# Patient Record
Sex: Female | Born: 1970
Health system: Southern US, Community
[De-identification: ages and names within clinical notes are randomized; demographics above are authoritative.]

---

## 1999-04-29 ENCOUNTER — Encounter: Payer: Self-pay | Admitting: Chiropractic Medicine

## 1999-04-29 ENCOUNTER — Ambulatory Visit (HOSPITAL_COMMUNITY): Admission: RE | Admit: 1999-04-29 | Discharge: 1999-04-29 | Payer: Self-pay | Admitting: Chiropractic Medicine

## 2000-11-28 ENCOUNTER — Other Ambulatory Visit: Admission: RE | Admit: 2000-11-28 | Discharge: 2000-11-28 | Payer: Self-pay | Admitting: Gynecology

## 2001-12-04 ENCOUNTER — Other Ambulatory Visit: Admission: RE | Admit: 2001-12-04 | Discharge: 2001-12-04 | Payer: Self-pay | Admitting: Gynecology

## 2002-09-21 ENCOUNTER — Ambulatory Visit (HOSPITAL_COMMUNITY): Admission: RE | Admit: 2002-09-21 | Discharge: 2002-09-21 | Payer: Self-pay | Admitting: Obstetrics and Gynecology

## 2002-09-21 ENCOUNTER — Encounter (INDEPENDENT_AMBULATORY_CARE_PROVIDER_SITE_OTHER): Payer: Self-pay | Admitting: *Deleted

## 2003-03-20 ENCOUNTER — Other Ambulatory Visit: Admission: RE | Admit: 2003-03-20 | Discharge: 2003-03-20 | Payer: Self-pay | Admitting: Obstetrics and Gynecology

## 2003-07-24 ENCOUNTER — Ambulatory Visit (HOSPITAL_COMMUNITY): Admission: RE | Admit: 2003-07-24 | Discharge: 2003-07-24 | Payer: Self-pay | Admitting: Obstetrics and Gynecology

## 2003-09-01 ENCOUNTER — Inpatient Hospital Stay (HOSPITAL_COMMUNITY): Admission: AD | Admit: 2003-09-01 | Discharge: 2003-09-06 | Payer: Self-pay | Admitting: Obstetrics and Gynecology

## 2003-09-02 ENCOUNTER — Encounter (INDEPENDENT_AMBULATORY_CARE_PROVIDER_SITE_OTHER): Payer: Self-pay | Admitting: Specialist

## 2003-09-07 ENCOUNTER — Encounter: Admission: RE | Admit: 2003-09-07 | Discharge: 2003-10-07 | Payer: Self-pay | Admitting: Obstetrics and Gynecology

## 2003-10-08 ENCOUNTER — Encounter: Admission: RE | Admit: 2003-10-08 | Discharge: 2003-11-07 | Payer: Self-pay | Admitting: Obstetrics and Gynecology

## 2003-10-10 ENCOUNTER — Other Ambulatory Visit: Admission: RE | Admit: 2003-10-10 | Discharge: 2003-10-10 | Payer: Self-pay | Admitting: Obstetrics and Gynecology

## 2003-12-06 ENCOUNTER — Encounter: Admission: RE | Admit: 2003-12-06 | Discharge: 2004-01-05 | Payer: Self-pay | Admitting: Obstetrics and Gynecology

## 2004-06-08 ENCOUNTER — Encounter: Admission: RE | Admit: 2004-06-08 | Discharge: 2004-06-08 | Payer: Self-pay | Admitting: Family Medicine

## 2004-11-30 ENCOUNTER — Other Ambulatory Visit: Admission: RE | Admit: 2004-11-30 | Discharge: 2004-11-30 | Payer: Self-pay | Admitting: Obstetrics and Gynecology

## 2005-03-05 ENCOUNTER — Encounter: Admission: RE | Admit: 2005-03-05 | Discharge: 2005-03-05 | Payer: Self-pay | Admitting: Family Medicine

## 2005-07-26 ENCOUNTER — Ambulatory Visit (HOSPITAL_COMMUNITY): Admission: RE | Admit: 2005-07-26 | Discharge: 2005-07-26 | Payer: Self-pay | Admitting: Obstetrics and Gynecology

## 2005-08-20 ENCOUNTER — Other Ambulatory Visit: Admission: RE | Admit: 2005-08-20 | Discharge: 2005-08-20 | Payer: Self-pay | Admitting: Obstetrics and Gynecology

## 2005-12-28 ENCOUNTER — Ambulatory Visit (HOSPITAL_COMMUNITY): Admission: RE | Admit: 2005-12-28 | Discharge: 2005-12-28 | Payer: Self-pay | Admitting: Obstetrics and Gynecology

## 2006-02-27 ENCOUNTER — Inpatient Hospital Stay (HOSPITAL_COMMUNITY): Admission: AD | Admit: 2006-02-27 | Discharge: 2006-02-28 | Payer: Self-pay | Admitting: Obstetrics and Gynecology

## 2006-02-28 ENCOUNTER — Inpatient Hospital Stay (HOSPITAL_COMMUNITY): Admission: AD | Admit: 2006-02-28 | Discharge: 2006-03-02 | Payer: Self-pay | Admitting: Obstetrics and Gynecology

## 2007-02-17 ENCOUNTER — Encounter: Admission: RE | Admit: 2007-02-17 | Discharge: 2007-02-17 | Payer: Self-pay | Admitting: Family Medicine

## 2007-04-19 ENCOUNTER — Encounter: Admission: RE | Admit: 2007-04-19 | Discharge: 2007-04-19 | Payer: Self-pay | Admitting: Obstetrics and Gynecology

## 2007-09-26 ENCOUNTER — Ambulatory Visit (HOSPITAL_COMMUNITY): Admission: RE | Admit: 2007-09-26 | Discharge: 2007-09-27 | Payer: Self-pay | Admitting: Neurosurgery

## 2007-11-13 ENCOUNTER — Encounter: Admission: RE | Admit: 2007-11-13 | Discharge: 2007-11-13 | Payer: Self-pay | Admitting: Obstetrics and Gynecology

## 2007-11-28 ENCOUNTER — Encounter: Admission: RE | Admit: 2007-11-28 | Discharge: 2007-11-28 | Payer: Self-pay | Admitting: Neurosurgery

## 2008-06-24 ENCOUNTER — Encounter: Admission: RE | Admit: 2008-06-24 | Discharge: 2008-06-24 | Payer: Self-pay | Admitting: Obstetrics and Gynecology

## 2008-09-20 ENCOUNTER — Encounter: Admission: RE | Admit: 2008-09-20 | Discharge: 2008-09-20 | Payer: Self-pay | Admitting: Obstetrics and Gynecology

## 2009-01-12 IMAGING — MG MM DIAGNOSTIC UNILATERAL R
2 series · 2 of 2 positions shown · non-contrast
Comparison: none

DG DIAGNOSTIC UNILATERAL R
CC and MLO view(s) were taken of the right breast.
Technologist: Rtoyota Joshjax

RIGHT BREAST ULTRASOUND
DIGITAL UNILATERAL RIGHT DIAGNOSTIC MAMMOGRAM AND RIGHT BREAST ULTRASOUND:
CLINICAL DATA: Patient returns after a baseline screening exam on 04/11/07 for evaluation of the 
right breast.  Patient's mother was diagnosed with breast cancer at age 55.

[R CC]
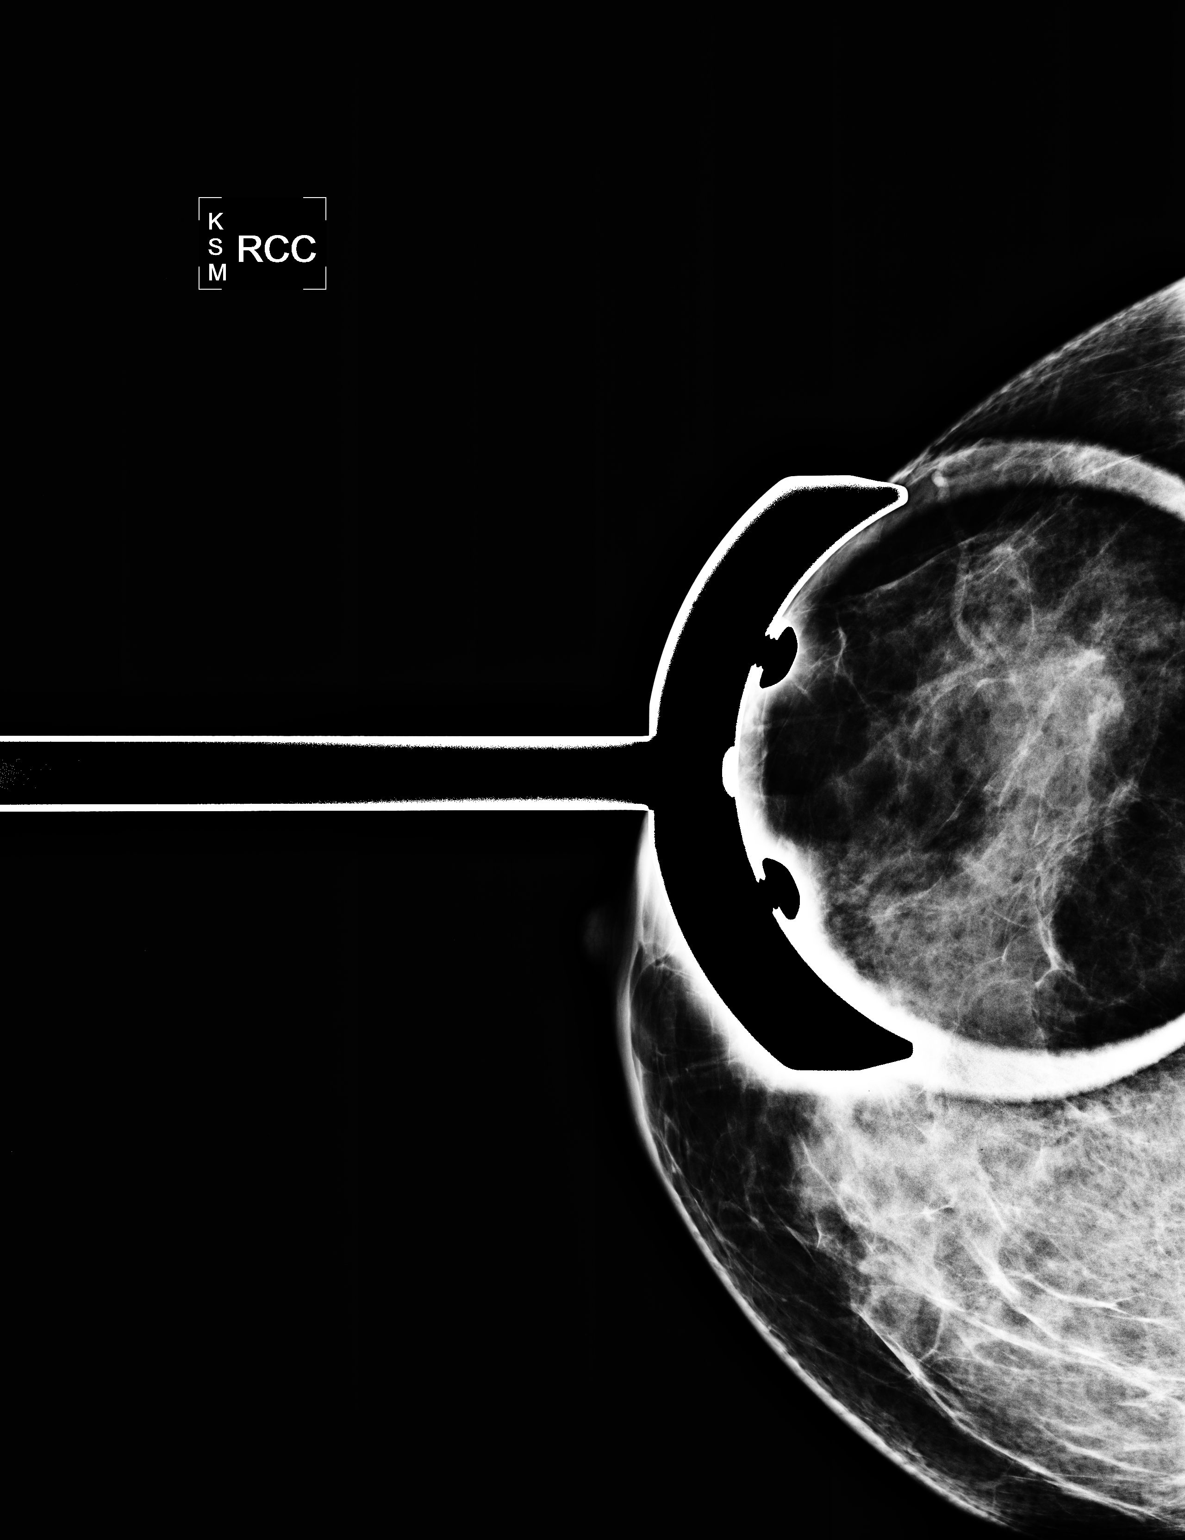

[R MLO]
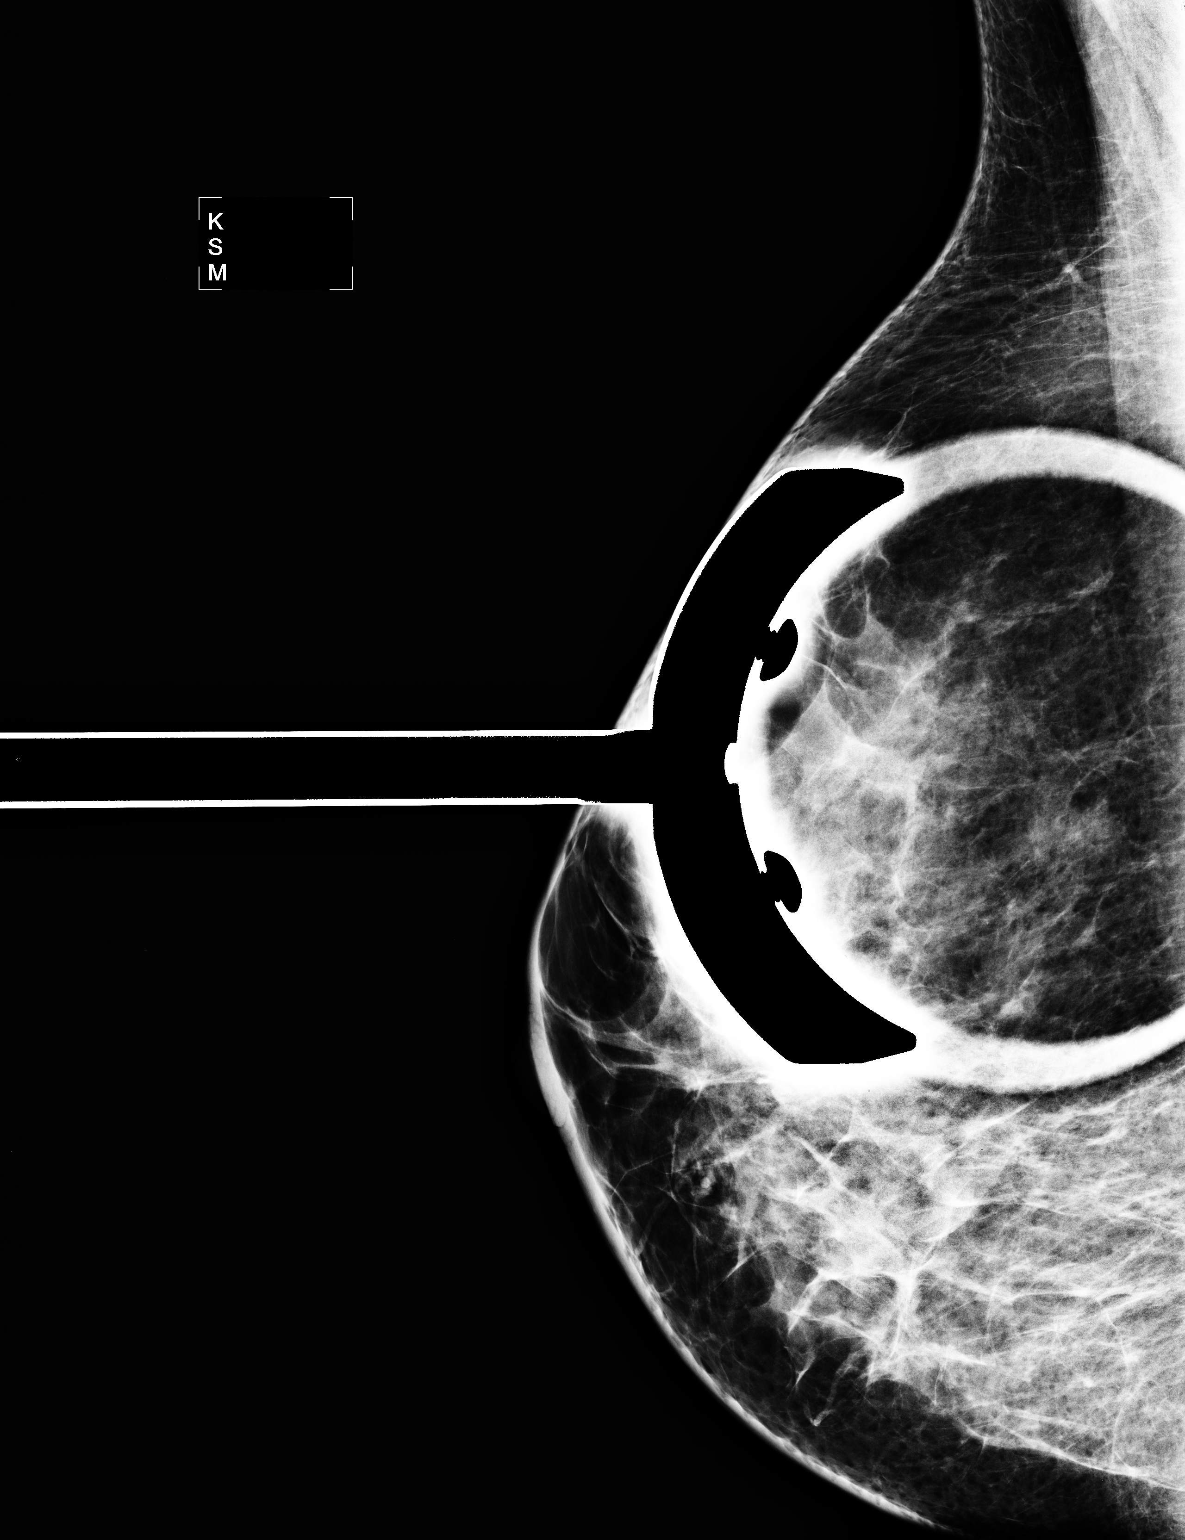

[2 of 2 positions shown; findings below may reference images not displayed]

Spot compression views are performed of the right breast, confirming the presence of an oval 
obscured nodule in the lateral portion of the right breast.  On physical exam, I do not palpate an 
abnormality in the lateral portion of the right breast.  Ultrasound is performed showing a 
well-circumscribed hypoechoic nodule with increased through-transmission in the 9 o'clock position 
of the right breast 6 cm from the nipple.  This measures 1.4 x 1.3 x 0.5 cm and correlates well 
with the mammographic abnormality.  Findings are consistent with benign fibroadenoma.  No worrisome
mass is identified
IMPRESSION: Probable fibroadenoma in the 9 o'clock position of the right breast on the baseline exam.  We 
discussed the options of biopsy versus close follow-up.  Close follow-up is suggested and a right 
breast ultrasound is recommended in six months.

ASSESSMENT: Probably benign - BI-RADS 3

Ultrasound of the right breast in 6 months.
,

## 2009-07-14 ENCOUNTER — Encounter: Admission: RE | Admit: 2009-07-14 | Discharge: 2009-07-14 | Payer: Self-pay | Admitting: Obstetrics and Gynecology

## 2010-09-27 ENCOUNTER — Encounter: Payer: Self-pay | Admitting: Obstetrics and Gynecology

## 2011-01-19 NOTE — Op Note (Signed)
NAMESAKEENAH, VALCARCEL             ACCOUNT NO.:  [id number]   MEDICAL RECORD NO.:  [id number]          PATIENT TYPE:  AMB   LOCATION:  SDS                          FACILITY:  [facility]   PHYSICIAN:  Henry A. Pool, M.D.    DATE OF BIRTH:  1971/08/10   DATE OF PROCEDURE:  09/26/2007  DATE OF DISCHARGE:                               OPERATIVE REPORT   SERVICE:  Neurosurgery.   PREOPERATIVE DIAGNOSIS:  C5-C6 spondylosis and right C6-C7 herniated  nucleus pulposus with radiculopathy.   POSTOPERATIVE DIAGNOSIS:  C5-C6 spondylosis and right C6-C7 herniated  nucleus pulposus with radiculopathy.   PROCEDURE:  C5-C6 and C6-C7 anterior cervical diskectomy and fusion with  allograft interplating.   SURGEON:  Kathaleen Maser. Pool, M.D.   ASSISTANT:  Reinaldo Meeker, M.D.   ANESTHESIA:  General oroendotracheal.   INDICATION:  Ms. Dancer is a 40 year old female with a history of neck  and right upper extremity pain, paresthesias, and weakness consistent  with a right-sided C7 radiculopathy.  Her workup demonstrates evidence  of a significant right-sided C6-C7 disk herniation, compression upon the  right-sided C7 nerve root.  The patient has coexistent cervical  spondylosis at C5-C6 with chronic neck pain.  We discussed the options  for management including both operative and nonoperative management.  We  discussed in detail the possibility of proceeding with a C5-C6 at C6-C7  anterior cervical diskectomy fusion with allograft interplating.  The  patient was made aware the risks and benefits and wished to proceed.   OPERATIVE NOTE:  The patient was taken to the operative room and placed  on the table in a supine position.  After adequate level of anesthesia  was achieved, the patient was positioned supine with neck slightly  extended and held by halter traction.  The patient's anterior cervical  region was prepped and draped sterilely.  A 10 blade was used to make  a  linear skin incision overlying  the C6 vertebral level.  This was carried  down sharply to the platysma.  The platysma was then divided vertically  and dissection proceeded on the medial border of the sternocleidomastoid  muscle and carotid sheath.  Trachea and esophagus were mobilized and  tracked towards the left.  Prevertebral fascia was stripped off the  anterior spinal column.  Longus coli muscle was then elevated  bilaterally using electrocautery.  Deep self-retaining retractor was  placed.  Intraoperative fluoroscopy was used.  C5-C6 and C6-C7 levels  were confirmed.  Disk space at both levels was then incised with a 15  blade in a rectangular fashion.  A wide disk space clean-out was then  achieved using pituitary rongeurs, Carlen curettes, Kerrison rongeurs,  high-speed drill carried almost to the disk removed down to the level of  posterior annulus.  The microscope was then brought in the field and  used throughout the remainder of diskectomy.  The remaining aspects of  the annulus and osteophytes removed using the high-speed drill down to  the level of the posterior longitudinal plane and was then elevated and  resected piecemeal fashion using Kerrison rongeurs where the underlying  thecal sac was identified.  Wide central decompression was then  performed by undercutting the bodies of C5 and C6 using Kerrison  rongeurs for decompression.  Decompression then proceeded out on each  neural foramen.  Wide anterior foraminotomy was then performed along the  course exiting C6 nerve roots bilaterally.  At this time, a very  thorough decompression was achieved with no evidence of injury to thecal  sac or nerve roots.  The C6-C7 level was approached in a similar fashion  once a good decompression was achieved.  A large amount of free disk  herniation off the right side at C6-C7 was encountered and completely  resected.  The wound was then irrigated with antibiotic solution.  Gelfoam was placed topically.  Hemostasis  was found to be good.  A 5-mm  Life allograft wedge was then packed into place and recessed roughly 1  mm from the anterior cortical margin at C5-C6.  A 6-mm wedge was placed  at C6-C7.  A 42-mm Atlantis anterior plate was then placed over the C5,  C6, and C7 levels.  This was attached under fluoroscopic guidance using  13-mm variable screws to each at all three levels.  All six screws were  given final tightening and found be solid to the bone.  The interlocking  screws engaged at all three levels.  Final images revealed good position  of bone grafts with hardware where appropriate and a normal spine.  The  wounds were then irrigated one final time.  Hemostasis was ensured with  electrocautery.  The wound was then closed in typical fashion.  Steri-  Strips and sterile dressing were applied.  There were no complications.  The patient tolerated the procedure well, and she returned to the  recovery room postoperatively.           ______________________________  Kathaleen Maser Pool, M.D.     HAP/MEDQ  D:  09/26/2007  T:  09/26/2007  Job:  [id number]

## 2011-01-22 NOTE — Op Note (Signed)
   NAME:  Betty Rice, Betty Rice                       ACCOUNT NO.:  [id number]   MEDICAL RECORD NO.:  [id number]                   PATIENT TYPE:  AMB   LOCATION:  SDC                                  FACILITY:  [facility]   PHYSICIAN:  Juluis Mire, M.D.                DATE OF BIRTH:  06-Sep-1971   DATE OF PROCEDURE:  09/21/2002  DATE OF DISCHARGE:                                 OPERATIVE REPORT   PREOPERATIVE DIAGNOSES:  Nonviable first trimester pregnancy.   POSTOPERATIVE DIAGNOSES:  Nonviable first trimester pregnancy.   OPERATIVE PROCEDURE:  1. Paracervical block.  2. Dilatation and evacuation.   SURGEON:  Juluis Mire, M.D.   ANESTHESIA:  Sedation with paracervical block.   ESTIMATED BLOOD LOSS:  Minimal.   PACKS AND DRAINS:  None.   INTRAOPERATIVE BLOOD PLACED:  None.   COMPLICATIONS:  None.   INDICATIONS:  Dictated in history and physical.   PROCEDURE:  The patient taken to the OR, placed in the supine position.  After slight sedation placed in the dorsal lithotomy position using Allen  stirrups.  The patient was draped out.  Speculum was placed in the vaginal  vault.  The cervix and vagina were cleansed with Betadine.  Paracervical  block was instituted using 1% Xylocaine.  Cervix was secured with a single  tooth tenaculum.  Uterus sounded to 10 cm.  The cervix was serially dilated  to size 29 Pratt dilator.  Size 8 curved suction curette was introduced and  contents removed using suction curetting.  Sharp curetting revealed all  quadrants to be clear.  Repeat suction curetting revealed all quadrants to  be clear.  Uterus was contracting down well with minimal bleeding, no signs  of perforation.  Single tooth tenaculum and speculum were then removed.  The  patient taken out of the dorsal lithotomy position, once alert, transferred  to recovery room in good condition.  RhoGAM will be given.  The patient will  be followed up postoperatively in the office.  Sponge,  instrument, needle  count reported as correct by circulating nurse.                                               Juluis Mire, M.D.    JSM/MEDQ  D:  09/21/2002  T:  09/21/2002  Job:  [id number]

## 2011-01-22 NOTE — H&P (Signed)
NAME:  Betty Rice, Betty Rice                       ACCOUNT NO.:  [id number]   MEDICAL RECORD NO.:  [id number]                   PATIENT TYPE:  AMB   LOCATION:  SDC                                  [facility]   PHYSICIAN:  Juluis Mire, M.D.                DATE OF BIRTH:  September 29, 1970   DATE OF ADMISSION:  09/21/2002  DATE OF DISCHARGE:                                HISTORY & PHYSICAL   HISTORY OF PRESENT ILLNESS:  A 40 year old primigravida married white female  who presented to the office with first trimester bleeding.  Last menstrual  period was 07/14/2001.  Ultrasound evaluation revealed a gestational sac  with fetal pole but no heart activity, consistent with a nonviable first  trimester pregnancy.  Crown-rump length was consistent with approximately  six weeks which would be consistent with dates.  The patient presents at the  present time to undergo dilatation and evacuation.  Blood type is A  negative.  RhoGAM will be required.   ALLERGIES:  No known drug allergies.   MEDICATIONS:  Prenatal vitamins.   PAST MEDICAL HISTORY:  She has had the usual childhood diseases.  No  significant sequelae.   PAST SURGICAL HISTORY:  This includes back surgery for disk and wisdom tooth  extraction.   FAMILY HISTORY:  Noncontributory.   SOCIAL HISTORY:  No tobacco or alcohol use.   REVIEW OF SYSTEMS:  Noncontributory.   PHYSICAL EXAMINATION:  VITAL SIGNS:  The patient is afebrile with stable  vital signs.  HEENT:  Head is normocephalic.  Pupils are equal, round, reactive to light  and accommodation.  Extraocular movements intact.  Sclerae and conjunctivae  clear.  Oropharynx clear.  NECK:  No thyromegaly.  BREASTS:  Not examined.  LUNGS:  Clear.  CARDIOVASCULAR:  Regular rate and rhythm with no murmurs, rubs, or gallops.  ABDOMEN:  Benign, no masses, no organomegaly or tenderness.  PELVIC:  Brownish discharge is noted.  Cervix is closed.  Uterus is 8 weeks  in size.  Adnexa  unremarkable.  EXTREMITIES:  There is trace edema.  NEUROLOGIC:  Grossly within normal limits.   IMPRESSION:  A nonviable first trimester pregnancy.   PLAN:  The patient will undergo dilatation and evacuation.  The risks have  been discussed including the risk of infection, risk of hemorrhage that  could require repeat D&C or possible transfusion with the risk of AIDS or  hepatitis.  Excessive bleeding could require hysterectomy.  We discussed the  risk of uterine perforation that could  lead to injury to adjacent organs requiring laparoscopy and possible  exploratory laparotomy, and finally, the he risk of deep venous thrombosis  and pulmonary embolus.  The patient expressed and understanding of the  indications and risks.  Juluis Mire, M.D.    JSM/MEDQ  D:  09/21/2002  T:  09/21/2002  Job:  [id number]

## 2011-01-22 NOTE — Op Note (Signed)
NAME:  Betty Rice, Betty Rice                       ACCOUNT NO.:  [id number]   MEDICAL RECORD NO.:  [id number]                   PATIENT TYPE:  MAT   LOCATION:  MATC                                 FACILITY:  [facility]   PHYSICIAN:  Dineen Kid. Rana Snare, M.D.                 DATE OF BIRTH:  09/24/1970   DATE OF PROCEDURE:  09/02/2003  DATE OF DISCHARGE:                                 OPERATIVE REPORT   PREOPERATIVE DIAGNOSES:  1. Intrauterine pregnancy at 33-3/7 weeks, with preterm labor.  2. Presents in occiput posterior presentation and failure to descend.   POSTOPERATIVE DIAGNOSES:  1. Intrauterine pregnancy at 33-3/7 weeks, with preterm labor.  2. Presents in occiput posterior presentation and failure to descend.   PROCEDURE:  Primary low segment transverse cesarean section.   SURGEON:  Dineen Kid. Rana Snare, M.D.   ANESTHESIA:  Epidural.   INDICATIONS:  [name] is a 40 year old who presented at 33-3/7 weeks with  an uncomplicated pregnancy.  She presented with premature rupture of  membranes and began in labor.  She progressed to 8 cm and had received  betamethasone and antibiotics.  At 8 cm she began having protracted labor.  Pitocin was used to augment labor.  She had a protracted labor to complete,  and was noted to be in the left occiput posterior presentation.  After  pushing approximately 2-1/2 hr, at first we were unable to manually rotate  fetus.  But, finally the fetus did rotate to a right occiput anterior  presentation and plus 2 station.  She was able to push to a plus 3 station  and because of maternal exhaustion at this point, offered her primary  cesarean section for failure to descend.  She requested vacuum extractor.  We discussed the risks of vacuum extractor with her, and also told her it is  normally not indicated for less than [redacted] weeks gestational age.  Did discuss  the risks of subgaleal bleed and the fact that the baby had a significant  amount of molding of the caput at  this point.  She had wanted to proceed  with an attempt at vacuum extractor, since she was close to vaginal  delivery.   Two attempts at vacuum extractor were performed, with gentle traction  applied.  Unable to keep the vacuum extractor on, due to the severe amount  of caput.  After two gentle attempts the vacuum was discontinued, and  because of failure to descend proceeded with primary low segment transverse  cesarean section.  The risks and benefits were discussed at length.  Informed consent was obtained.   FINDINGS AT TIME OF SURGERY:  Viable female infant.  Apgars 7 and 8.  pH  arterial is currently pending.  The baby did go to the NICU due to  prematurity.   DESCRIPTION OF PROCEDURE:  After adequate analgesia, the patient is placed  in the supine position with the left  lateral tilt.  She was sterilely  prepped and draped.  The bladder was sterilely drained with Foley catheter.  A Pfannenstiel skin incision was made two fingerbreadths above the pubic  symphysis, taken down sharply to the fascia (which was incised transversely,  then superiorly and inferiorly off the rectus muscle).  The fascia was  incised transversely and the superior and inferior Colles rectus muscle, the  peritoneum was entered sharply.  A bladder flap was created and placed  behind the bladder blade.  A low segment median incision was made down to  the infant's vertex, extended laterally with operator's fingertips.  The  infant's vertex was delivered atraumatically.  The fetus had reverted back  to an occiput posterior presentation.  The nares and pharynx were suctioned.  The infant was then delivered.  The cord clamped and cut, and the infant  handed to the pediatricians for resuscitation.  Cord blood was obtained.  The placenta extracted manually.  The uterus was exteriorized, wiped clean  with a dry lap.  The median incision was closed in two layers, the first  being a running, locking layer and the  second being an imbricating layer of  0 Monocryl suture.  Irrigation was applied, and after adequate hemostasis  the peritoneum was closed with 0 Monocryl suture.  The rectus muscle  plicated in the midline.  After adequate was achieved, the fascia was closed  with a single suture of #1 Vicryl in running fashion.  Irrigation was  applied and after adequate hemostasis a 2-0 plain suture was used to close  the _________ fascia.  Irrigation was applied again and after adequate  hemostasis the skin was stapled and Steri-Strips applied.  The patient  tolerated the procedure well.  She was stable on transfer to the recovery  room.  Sponge and instrument count was normal x3.  The patient received 1 g  of Rocephin after the delivery of the placenta.                                               Dineen Kid Rana Snare, M.D.    DCL/MEDQ  D:  09/02/2003  T:  09/02/2003  Job:  [id number]

## 2011-01-22 NOTE — Discharge Summary (Signed)
NAME:  Betty Rice, Betty Rice                       ACCOUNT NO.:  [id number]   MEDICAL RECORD NO.:  [id number]                   PATIENT TYPE:  INP   LOCATION:  9318                                 FACILITY:  [facility]   PHYSICIAN:  Michelle L. Vincente Poli, M.D.            DATE OF BIRTH:  Jan 05, 1971   DATE OF ADMISSION:  09/01/2003  DATE OF DISCHARGE:  09/06/2003                                 DISCHARGE SUMMARY   ADMITTING DIAGNOSES:  1. Intrauterine pregnancy at 33-3/7th weeks.  2. Preterm labor with preterm rupture of membranes.   DISCHARGE DIAGNOSIS:  Status post low-transverse cesarean section secondary  to failure to descend to a viable female infant.   PROCEDURE:  Primary low-transverse cesarean section.   REASON FOR ADMISSION:  Please see written H&P.   HOSPITAL COURSE:  The patient was a 40 year old gravida 2, para 0 that  presented to [facility] at 33-3/7th weeks with uncomplicated  pregnancy.  The patient had presented with preterm rupture of membranes and  in labor.  She progressed to 8-cm and had received betamethasone and  antibiotics.  At 8-cm, the patient was noted to have protracted labor.  Pitocin was then given to augment her labor.  The patient then did progress  to full dilation and vertex was noted to be in the left occiput posterior  presentation.  After pushing for approximately 2.5 hours, the fetus did  finally rotate to the right occiput anterior presentation at a +2 station.  The patient was able to push to vertex to a +3 station but because of  maternal exhaustion at this point the patient was offered a primary cesarean  section for failure to descend.  The patient requested a vacuum extractor  and after risks associated with vacuum extractor were discussed with the  patient and spouse at a [redacted] week gestation, two attempts were made with  gentle traction applied.  Due to inability to maintain the vacuum extractor  due to the severe amount of caput, the  decision was then made to proceed  with a low-transverse cesarean section.  The patient was then transferred to  the operating room where an epidural was dosed to an adequate surgical  level.  A low-transverse incision was made with the delivery of a viable  female infant with Apgar's of 7 at one minute, 8 at five minutes.  The  patient was then transferred to the recovery room in stable condition.  The  baby was transferred to the NICU due to prematurity.   On postoperative day one, the patient was without complaint.  Vital signs  were stable.  Abdomen soft and nontender.  Abdominal dressing had been  removed revealing an incision that was clean, dry and intact.  Labs revealed  a hemoglobin of 10.4, platelet count of 215,000, WBC count of 14.6.  On  postoperative day three, vital signs were stable.  Abdomen was soft and  nontender.  The  incision was clean, dry and intact.  The patient was  ambulating well and tolerating a regular diet without complaints of nausea  or vomiting.  On postoperative day four, vital signs were stable.  Abdomen  was soft and nontender.  Incision was clean, dry and intact.  The staples  were removed and the patient was discharged home.   CONDITION ON DISCHARGE:  Good.   DIET:  Regular as tolerated.   ACTIVITY:  No heavy lifting.  No driving x 2 weeks.  No vaginal entry.   FOLLOWUP:  The patient is to followup in the office in one week for an  incision check.  She is to call for a temperature greater than 100 degrees,  persistent nausea, vomiting, heavy vaginal bleeding and/or redness or  drainage from the incisional site.   DISCHARGE MEDICATIONS:  1. Tylox #30, one p.o. every four to six hours p.r.n.  2. Ibuprofen 600 mg every six hours.  3. Prenatal vitamins one p.o. daily.  4. Colace one p.o. daily p.r.n.     Julio Sicks, N.P.                        Stann Mainland. Vincente Poli, M.D.    CC/MEDQ  D:  10/11/2003  T:  10/11/2003  Job:  [id number]

## 2011-05-27 LAB — PROTIME-INR
INR: 0.8
Prothrombin Time: 11.7

## 2011-05-27 LAB — CBC
MCV: 90.8
Platelets: 309
WBC: 7

## 2011-05-27 LAB — DIFFERENTIAL
Basophils Relative: 0
Eosinophils Absolute: 0.2
Lymphs Abs: 2.1
Neutro Abs: 4.1
Neutrophils Relative %: 59

## 2011-05-27 LAB — APTT: aPTT: 26

## 2011-05-27 LAB — BASIC METABOLIC PANEL
BUN: 10
Calcium: 9.7
Chloride: 102
Creatinine, Ser: 0.66
GFR calc Af Amer: >60

## 2011-06-18 ENCOUNTER — Other Ambulatory Visit: Payer: Self-pay | Admitting: Obstetrics and Gynecology

## 2011-06-18 DIAGNOSIS — Z1231 Encounter for screening mammogram for malignant neoplasm of breast: Secondary | ICD-10-CM

## 2011-06-29 ENCOUNTER — Ambulatory Visit
Admission: RE | Admit: 2011-06-29 | Discharge: 2011-06-29 | Disposition: A | Discharge status: Home / Self Care | Payer: BC Managed Care – PPO | Source: Ambulatory Visit | Attending: Obstetrics and Gynecology | Admitting: Obstetrics and Gynecology

## 2011-06-29 DIAGNOSIS — Z1231 Encounter for screening mammogram for malignant neoplasm of breast: Secondary | ICD-10-CM

## 2011-10-20 ENCOUNTER — Other Ambulatory Visit: Payer: Self-pay | Admitting: Family Medicine

## 2011-10-20 ENCOUNTER — Ambulatory Visit
Admission: RE | Admit: 2011-10-20 | Discharge: 2011-10-20 | Disposition: A | Discharge status: Home / Self Care | Payer: BC Managed Care – PPO | Source: Ambulatory Visit | Attending: Family Medicine | Admitting: Family Medicine

## 2011-10-20 DIAGNOSIS — R52 Pain, unspecified: Secondary | ICD-10-CM

## 2011-10-20 DIAGNOSIS — R109 Unspecified abdominal pain: Secondary | ICD-10-CM

## 2011-10-20 MED ORDER — IOHEXOL 300 MG/ML  SOLN
100.0000 mL | Freq: Once | INTRAMUSCULAR | Status: AC | PRN
  Administered 2011-10-20: 100 mL via INTRAVENOUS

## 2012-01-03 ENCOUNTER — Other Ambulatory Visit: Payer: Self-pay | Admitting: Dermatology

## 2014-03-27 ENCOUNTER — Other Ambulatory Visit: Payer: Self-pay | Admitting: Obstetrics and Gynecology

## 2014-03-27 DIAGNOSIS — N644 Mastodynia: Secondary | ICD-10-CM

## 2014-04-05 ENCOUNTER — Ambulatory Visit
Admission: RE | Admit: 2014-04-05 | Discharge: 2014-04-05 | Disposition: A | Discharge status: Home / Self Care | Payer: BC Managed Care – PPO | Source: Ambulatory Visit | Attending: Obstetrics and Gynecology | Admitting: Obstetrics and Gynecology

## 2014-04-05 DIAGNOSIS — N644 Mastodynia: Secondary | ICD-10-CM

## 2014-08-19 ENCOUNTER — Other Ambulatory Visit: Payer: Self-pay | Admitting: Dermatology

## 2015-06-30 ENCOUNTER — Other Ambulatory Visit: Payer: Self-pay | Admitting: Obstetrics and Gynecology

## 2015-06-30 DIAGNOSIS — R928 Other abnormal and inconclusive findings on diagnostic imaging of breast: Secondary | ICD-10-CM

## 2015-07-04 ENCOUNTER — Ambulatory Visit
Admission: RE | Admit: 2015-07-04 | Discharge: 2015-07-04 | Disposition: A | Discharge status: Home / Self Care | Payer: BLUE CROSS/BLUE SHIELD | Source: Ambulatory Visit | Attending: Obstetrics and Gynecology | Admitting: Obstetrics and Gynecology

## 2015-07-04 DIAGNOSIS — R928 Other abnormal and inconclusive findings on diagnostic imaging of breast: Secondary | ICD-10-CM

## 2016-02-17 ENCOUNTER — Other Ambulatory Visit: Payer: Self-pay | Admitting: Gastroenterology

## 2016-02-17 DIAGNOSIS — R1013 Epigastric pain: Secondary | ICD-10-CM

## 2016-02-18 ENCOUNTER — Other Ambulatory Visit: Payer: BLUE CROSS/BLUE SHIELD

## 2016-02-19 ENCOUNTER — Ambulatory Visit
Admission: RE | Admit: 2016-02-19 | Discharge: 2016-02-19 | Disposition: A | Discharge status: Home / Self Care | Payer: BLUE CROSS/BLUE SHIELD | Source: Ambulatory Visit | Attending: Gastroenterology | Admitting: Gastroenterology

## 2016-02-19 DIAGNOSIS — R1013 Epigastric pain: Secondary | ICD-10-CM

## 2016-09-02 DIAGNOSIS — L209 Atopic dermatitis, unspecified: Secondary | ICD-10-CM | POA: Diagnosis not present

## 2016-11-26 DIAGNOSIS — M7711 Lateral epicondylitis, right elbow: Secondary | ICD-10-CM | POA: Diagnosis not present

## 2017-02-15 DIAGNOSIS — M7711 Lateral epicondylitis, right elbow: Secondary | ICD-10-CM | POA: Diagnosis not present

## 2017-02-15 DIAGNOSIS — M7712 Lateral epicondylitis, left elbow: Secondary | ICD-10-CM | POA: Diagnosis not present

## 2017-03-21 DIAGNOSIS — M7712 Lateral epicondylitis, left elbow: Secondary | ICD-10-CM | POA: Diagnosis not present

## 2017-03-21 DIAGNOSIS — M7711 Lateral epicondylitis, right elbow: Secondary | ICD-10-CM | POA: Diagnosis not present

## 2017-09-12 DIAGNOSIS — M7710 Lateral epicondylitis, unspecified elbow: Secondary | ICD-10-CM | POA: Diagnosis not present

## 2017-09-12 DIAGNOSIS — R0683 Snoring: Secondary | ICD-10-CM | POA: Diagnosis not present

## 2017-09-16 DIAGNOSIS — E7800 Pure hypercholesterolemia, unspecified: Secondary | ICD-10-CM | POA: Diagnosis not present

## 2017-09-16 DIAGNOSIS — R5383 Other fatigue: Secondary | ICD-10-CM | POA: Diagnosis not present

## 2017-09-29 DIAGNOSIS — J342 Deviated nasal septum: Secondary | ICD-10-CM | POA: Diagnosis not present

## 2017-09-29 DIAGNOSIS — R0981 Nasal congestion: Secondary | ICD-10-CM | POA: Diagnosis not present

## 2017-10-18 ENCOUNTER — Other Ambulatory Visit (HOSPITAL_BASED_OUTPATIENT_CLINIC_OR_DEPARTMENT_OTHER): Payer: Self-pay

## 2017-10-18 DIAGNOSIS — R0683 Snoring: Secondary | ICD-10-CM

## 2017-10-26 ENCOUNTER — Ambulatory Visit (HOSPITAL_BASED_OUTPATIENT_CLINIC_OR_DEPARTMENT_OTHER): Payer: BLUE CROSS/BLUE SHIELD | Attending: Otolaryngology | Admitting: Internal Medicine

## 2017-10-26 DIAGNOSIS — R0683 Snoring: Secondary | ICD-10-CM | POA: Insufficient documentation

## 2017-10-26 DIAGNOSIS — G4733 Obstructive sleep apnea (adult) (pediatric): Secondary | ICD-10-CM | POA: Diagnosis not present

## 2017-11-07 DIAGNOSIS — R0683 Snoring: Secondary | ICD-10-CM | POA: Diagnosis not present

## 2017-11-07 NOTE — Procedures (Signed)
Patient Name: Betty Rice, Betty Rice Date: 10/26/2017 Gender: Female D.O.B: [birth year] Age (years): 46 Referring Provider: Melissa Montane Height (inches): 17 Interpreting Physician: Baird Lyons MD, ABSM Weight (lbs): 162 RPSGT: Laren Everts BMI: 29 MRN: [id number] Neck Size: 15.00 CLINICAL INFORMATION Sleep Study Type: Split Night CPAP  Indication for sleep study: Snoring  Epworth Sleepiness Score: 2  SLEEP STUDY TECHNIQUE As per the AASM Manual for the Scoring of Sleep and Associated Events v2.3 (April 2016) with a hypopnea requiring 4% desaturations.  The channels recorded and monitored were frontal, central and occipital EEG, electrooculogram (EOG), submentalis EMG (chin), nasal and oral airflow, thoracic and abdominal wall motion, anterior tibialis EMG, snore microphone, electrocardiogram, and pulse oximetry. Continuous positive airway pressure (CPAP) was initiated when the patient met split night criteria and was titrated according to treat sleep-disordered breathing.  MEDICATIONS Medications self-administered by patient taken the night of the study : none reported  RESPIRATORY PARAMETERS Diagnostic  Total AHI (/hr): 24.8 RDI (/hr): 36.0 OA Index (/hr): 10.4 CA Index (/hr): 0.0 REM AHI (/hr): 33.0 NREM AHI (/hr): 22.3 Supine AHI (/hr): N/A Non-supine AHI (/hr): 24.84 Min O2 Sat (%): 81.0 Mean O2 (%): 93.0 Time below 88% (min): 3.2   Titration  Optimal Pressure (cm): 10 AHI at Optimal Pressure (/hr): 0.0 Min O2 at Optimal Pressure (%): 91.0 Supine % at Optimal (%): 100 Sleep % at Optimal (%): 96   SLEEP ARCHITECTURE The recording time for the entire night was 458.2 minutes.  During a baseline period of 170.7 minutes, the patient slept for 144.9 minutes in REM and nonREM, yielding a sleep efficiency of 84.9%%. Sleep onset after lights out was 11.8 minutes with a REM latency of 118.5 minutes. The patient spent 13.1%% of the night in stage N1 sleep, 63.1%% in stage  N2 sleep, 0.0%% in stage N3 and 23.8%% in REM.  During the titration period of 280.7 minutes, the patient slept for 200.5 minutes in REM and nonREM, yielding a sleep efficiency of 71.4%%. Sleep onset after CPAP initiation was 38.8 minutes with a REM latency of 120.5 minutes. The patient spent 14.2%% of the night in stage N1 sleep, 67.3%% in stage N2 sleep, 0.0%% in stage N3 and 18.5%% in REM.  CARDIAC DATA The 2 lead EKG demonstrated sinus rhythm. The mean heart rate was 100.0 beats per minute. Other EKG findings include: None.  LEG MOVEMENT DATA The total Periodic Limb Movements of Sleep (PLMS) were 0. The PLMS index was 0.0 .  IMPRESSIONS - Moderate obstructive sleep apnea occurred during the diagnostic portion of the study(AHI = 24.8/hour). An optimal PAP pressure was selected for this patient ( 10 cm of water) - No significant central sleep apnea occurred during the diagnostic portion of the study (CAI = 0.0/hour). - The patient had mild oxygen desaturation during the diagnostic portion of the study (Min O2 = 81.0%) - The patient snored with loud snoring volume during the diagnostic portion of the study. - No cardiac abnormalities were noted during this study. - Clinically significant periodic limb movements did not occur during sleep.  DIAGNOSIS - Obstructive Sleep Apnea (327.23 [G47.33 ICD-10])  RECOMMENDATIONS - Trial of CPAP therapy on 10 cm H2O with a Small size Resmed Full Face Mask AirFit F20 mask and heated humidification. - Be careful with alcohol, sedatives and other CNS depressants that may worsen sleep apnea and disrupt normal sleep architecture. - Sleep hygiene should be reviewed to assess factors that may improve sleep quality. - Weight management and  regular exercise should be initiated or continued.  [Electronically signed] 11/07/2017 08:21 AM  Baird Lyons MD, ABSM Diplomate, American Board of Sleep Medicine   NPI: [id number]                          [place] [place] [facility]  ELECTRONICALLY SIGNED ON:  11/07/2017, 8:20 AM [place] PH: [phone]   FX: [phone] [place]

## 2017-11-25 DIAGNOSIS — G4733 Obstructive sleep apnea (adult) (pediatric): Secondary | ICD-10-CM | POA: Diagnosis not present

## 2017-12-19 DIAGNOSIS — G4733 Obstructive sleep apnea (adult) (pediatric): Secondary | ICD-10-CM | POA: Diagnosis not present

## 2017-12-26 DIAGNOSIS — G4733 Obstructive sleep apnea (adult) (pediatric): Secondary | ICD-10-CM | POA: Diagnosis not present

## 2018-01-20 DIAGNOSIS — L089 Local infection of the skin and subcutaneous tissue, unspecified: Secondary | ICD-10-CM | POA: Diagnosis not present

## 2018-01-20 DIAGNOSIS — S0031XA Abrasion of nose, initial encounter: Secondary | ICD-10-CM | POA: Diagnosis not present

## 2018-01-20 DIAGNOSIS — H10021 Other mucopurulent conjunctivitis, right eye: Secondary | ICD-10-CM | POA: Diagnosis not present

## 2018-01-20 DIAGNOSIS — J0100 Acute maxillary sinusitis, unspecified: Secondary | ICD-10-CM | POA: Diagnosis not present

## 2018-01-25 DIAGNOSIS — G4733 Obstructive sleep apnea (adult) (pediatric): Secondary | ICD-10-CM | POA: Diagnosis not present

## 2018-02-25 DIAGNOSIS — G4733 Obstructive sleep apnea (adult) (pediatric): Secondary | ICD-10-CM | POA: Diagnosis not present

## 2018-03-27 DIAGNOSIS — G4733 Obstructive sleep apnea (adult) (pediatric): Secondary | ICD-10-CM | POA: Diagnosis not present

## 2018-11-01 DIAGNOSIS — R87610 Atypical squamous cells of undetermined significance on cytologic smear of cervix (ASC-US): Secondary | ICD-10-CM | POA: Diagnosis not present

## 2018-11-01 DIAGNOSIS — R87612 Low grade squamous intraepithelial lesion on cytologic smear of cervix (LGSIL): Secondary | ICD-10-CM | POA: Diagnosis not present

## 2019-02-01 DIAGNOSIS — R87810 Cervical high risk human papillomavirus (HPV) DNA test positive: Secondary | ICD-10-CM | POA: Diagnosis not present

## 2019-02-01 DIAGNOSIS — R87612 Low grade squamous intraepithelial lesion on cytologic smear of cervix (LGSIL): Secondary | ICD-10-CM | POA: Diagnosis not present

## 2019-04-26 DIAGNOSIS — Z6830 Body mass index (BMI) 30.0-30.9, adult: Secondary | ICD-10-CM | POA: Diagnosis not present

## 2019-04-26 DIAGNOSIS — Z01419 Encounter for gynecological examination (general) (routine) without abnormal findings: Secondary | ICD-10-CM | POA: Diagnosis not present

## 2019-04-26 DIAGNOSIS — R87612 Low grade squamous intraepithelial lesion on cytologic smear of cervix (LGSIL): Secondary | ICD-10-CM | POA: Diagnosis not present

## 2019-05-16 DIAGNOSIS — Z1231 Encounter for screening mammogram for malignant neoplasm of breast: Secondary | ICD-10-CM | POA: Diagnosis not present

## 2019-06-29 DIAGNOSIS — Z20828 Contact with and (suspected) exposure to other viral communicable diseases: Secondary | ICD-10-CM | POA: Diagnosis not present

## 2019-09-11 DIAGNOSIS — R432 Parageusia: Secondary | ICD-10-CM | POA: Diagnosis not present

## 2019-09-11 DIAGNOSIS — U071 COVID-19: Secondary | ICD-10-CM | POA: Diagnosis not present

## 2019-09-11 DIAGNOSIS — R0981 Nasal congestion: Secondary | ICD-10-CM | POA: Diagnosis not present

## 2019-09-11 DIAGNOSIS — Z20822 Contact with and (suspected) exposure to covid-19: Secondary | ICD-10-CM | POA: Diagnosis not present

## 2019-09-13 DIAGNOSIS — K219 Gastro-esophageal reflux disease without esophagitis: Secondary | ICD-10-CM | POA: Diagnosis not present

## 2019-09-13 DIAGNOSIS — U071 COVID-19: Secondary | ICD-10-CM | POA: Diagnosis not present

## 2019-09-13 DIAGNOSIS — R432 Parageusia: Secondary | ICD-10-CM | POA: Diagnosis not present

## 2020-02-21 DIAGNOSIS — Z03818 Encounter for observation for suspected exposure to other biological agents ruled out: Secondary | ICD-10-CM | POA: Diagnosis not present

## 2020-02-21 DIAGNOSIS — Z20822 Contact with and (suspected) exposure to covid-19: Secondary | ICD-10-CM | POA: Diagnosis not present

## 2020-05-06 DIAGNOSIS — Z136 Encounter for screening for cardiovascular disorders: Secondary | ICD-10-CM | POA: Diagnosis not present

## 2020-05-06 DIAGNOSIS — R030 Elevated blood-pressure reading, without diagnosis of hypertension: Secondary | ICD-10-CM | POA: Diagnosis not present

## 2020-05-06 DIAGNOSIS — H8110 Benign paroxysmal vertigo, unspecified ear: Secondary | ICD-10-CM | POA: Diagnosis not present

## 2020-09-30 DIAGNOSIS — Z6829 Body mass index (BMI) 29.0-29.9, adult: Secondary | ICD-10-CM | POA: Diagnosis not present

## 2020-09-30 DIAGNOSIS — Z01419 Encounter for gynecological examination (general) (routine) without abnormal findings: Secondary | ICD-10-CM | POA: Diagnosis not present

## 2020-09-30 DIAGNOSIS — Z3202 Encounter for pregnancy test, result negative: Secondary | ICD-10-CM | POA: Diagnosis not present

## 2020-09-30 DIAGNOSIS — Z1231 Encounter for screening mammogram for malignant neoplasm of breast: Secondary | ICD-10-CM | POA: Diagnosis not present

## 2020-12-03 DIAGNOSIS — R0789 Other chest pain: Secondary | ICD-10-CM | POA: Diagnosis not present

## 2020-12-03 DIAGNOSIS — K219 Gastro-esophageal reflux disease without esophagitis: Secondary | ICD-10-CM | POA: Diagnosis not present

## 2020-12-03 DIAGNOSIS — S46811A Strain of other muscles, fascia and tendons at shoulder and upper arm level, right arm, initial encounter: Secondary | ICD-10-CM | POA: Diagnosis not present

## 2021-02-16 DIAGNOSIS — R590 Localized enlarged lymph nodes: Secondary | ICD-10-CM | POA: Diagnosis not present

## 2021-02-20 DIAGNOSIS — R102 Pelvic and perineal pain: Secondary | ICD-10-CM | POA: Diagnosis not present

## 2021-02-20 DIAGNOSIS — N760 Acute vaginitis: Secondary | ICD-10-CM | POA: Diagnosis not present

## 2021-03-05 DIAGNOSIS — M25559 Pain in unspecified hip: Secondary | ICD-10-CM | POA: Diagnosis not present

## 2021-03-05 DIAGNOSIS — R7309 Other abnormal glucose: Secondary | ICD-10-CM | POA: Diagnosis not present

## 2021-04-08 DIAGNOSIS — Z13220 Encounter for screening for lipoid disorders: Secondary | ICD-10-CM | POA: Diagnosis not present

## 2021-04-08 DIAGNOSIS — R945 Abnormal results of liver function studies: Secondary | ICD-10-CM | POA: Diagnosis not present

## 2021-06-08 DIAGNOSIS — R7309 Other abnormal glucose: Secondary | ICD-10-CM | POA: Diagnosis not present

## 2021-06-08 DIAGNOSIS — R030 Elevated blood-pressure reading, without diagnosis of hypertension: Secondary | ICD-10-CM | POA: Diagnosis not present

## 2021-06-08 DIAGNOSIS — E785 Hyperlipidemia, unspecified: Secondary | ICD-10-CM | POA: Diagnosis not present

## 2021-06-11 ENCOUNTER — Other Ambulatory Visit (HOSPITAL_BASED_OUTPATIENT_CLINIC_OR_DEPARTMENT_OTHER): Payer: Self-pay | Admitting: Family Medicine

## 2021-06-29 ENCOUNTER — Encounter (HOSPITAL_BASED_OUTPATIENT_CLINIC_OR_DEPARTMENT_OTHER): Payer: Self-pay

## 2021-06-29 ENCOUNTER — Ambulatory Visit (HOSPITAL_BASED_OUTPATIENT_CLINIC_OR_DEPARTMENT_OTHER)
Admission: RE | Admit: 2021-06-29 | Discharge: 2021-06-29 | Disposition: A | Discharge status: Home / Self Care | Payer: BLUE CROSS/BLUE SHIELD | Source: Ambulatory Visit | Attending: Family Medicine | Admitting: Family Medicine

## 2021-06-29 ENCOUNTER — Other Ambulatory Visit: Payer: Self-pay

## 2021-06-29 DIAGNOSIS — J8410 Pulmonary fibrosis, unspecified: Secondary | ICD-10-CM | POA: Insufficient documentation

## 2021-09-09 DIAGNOSIS — R1032 Left lower quadrant pain: Secondary | ICD-10-CM | POA: Diagnosis not present

## 2021-09-09 DIAGNOSIS — E1165 Type 2 diabetes mellitus with hyperglycemia: Secondary | ICD-10-CM | POA: Diagnosis not present

## 2021-10-19 ENCOUNTER — Other Ambulatory Visit: Payer: Self-pay | Admitting: Physician Assistant

## 2021-10-19 DIAGNOSIS — R1032 Left lower quadrant pain: Secondary | ICD-10-CM | POA: Diagnosis not present

## 2021-10-19 DIAGNOSIS — Z1211 Encounter for screening for malignant neoplasm of colon: Secondary | ICD-10-CM | POA: Diagnosis not present

## 2021-10-19 DIAGNOSIS — K219 Gastro-esophageal reflux disease without esophagitis: Secondary | ICD-10-CM | POA: Diagnosis not present

## 2021-11-09 ENCOUNTER — Ambulatory Visit
Admission: RE | Admit: 2021-11-09 | Discharge: 2021-11-09 | Disposition: A | Discharge status: Home / Self Care | Payer: BC Managed Care – PPO | Source: Ambulatory Visit | Attending: Physician Assistant | Admitting: Physician Assistant

## 2021-11-09 DIAGNOSIS — I700 Atherosclerosis of aorta: Secondary | ICD-10-CM | POA: Diagnosis not present

## 2021-11-09 DIAGNOSIS — R1032 Left lower quadrant pain: Secondary | ICD-10-CM | POA: Diagnosis not present

## 2021-11-09 MED ORDER — IOPAMIDOL (ISOVUE-300) INJECTION 61%
100.0000 mL | Freq: Once | INTRAVENOUS | Status: AC | PRN
  Administered 2021-11-09: 100 mL via INTRAVENOUS

## 2021-11-11 DIAGNOSIS — D3132 Benign neoplasm of left choroid: Secondary | ICD-10-CM | POA: Diagnosis not present

## 2021-11-11 DIAGNOSIS — E119 Type 2 diabetes mellitus without complications: Secondary | ICD-10-CM | POA: Diagnosis not present

## 2021-12-10 DIAGNOSIS — E1165 Type 2 diabetes mellitus with hyperglycemia: Secondary | ICD-10-CM | POA: Diagnosis not present

## 2021-12-10 DIAGNOSIS — K5900 Constipation, unspecified: Secondary | ICD-10-CM | POA: Diagnosis not present

## 2022-01-04 DIAGNOSIS — H6502 Acute serous otitis media, left ear: Secondary | ICD-10-CM | POA: Diagnosis not present

## 2022-01-29 DIAGNOSIS — K317 Polyp of stomach and duodenum: Secondary | ICD-10-CM | POA: Diagnosis not present

## 2022-01-29 DIAGNOSIS — K219 Gastro-esophageal reflux disease without esophagitis: Secondary | ICD-10-CM | POA: Diagnosis not present

## 2022-01-29 DIAGNOSIS — Z1211 Encounter for screening for malignant neoplasm of colon: Secondary | ICD-10-CM | POA: Diagnosis not present

## 2022-02-22 DIAGNOSIS — N132 Hydronephrosis with renal and ureteral calculous obstruction: Secondary | ICD-10-CM | POA: Diagnosis not present

## 2022-02-22 DIAGNOSIS — N201 Calculus of ureter: Secondary | ICD-10-CM | POA: Diagnosis not present

## 2022-02-22 DIAGNOSIS — R350 Frequency of micturition: Secondary | ICD-10-CM | POA: Diagnosis not present

## 2022-02-22 DIAGNOSIS — N134 Hydroureter: Secondary | ICD-10-CM | POA: Diagnosis not present

## 2022-02-22 DIAGNOSIS — R109 Unspecified abdominal pain: Secondary | ICD-10-CM | POA: Diagnosis not present

## 2022-03-15 DIAGNOSIS — E7800 Pure hypercholesterolemia, unspecified: Secondary | ICD-10-CM | POA: Diagnosis not present

## 2022-03-15 DIAGNOSIS — E1165 Type 2 diabetes mellitus with hyperglycemia: Secondary | ICD-10-CM | POA: Diagnosis not present

## 2022-04-21 DIAGNOSIS — Z01419 Encounter for gynecological examination (general) (routine) without abnormal findings: Secondary | ICD-10-CM | POA: Diagnosis not present

## 2022-04-21 DIAGNOSIS — Z6829 Body mass index (BMI) 29.0-29.9, adult: Secondary | ICD-10-CM | POA: Diagnosis not present

## 2022-04-21 DIAGNOSIS — Z1231 Encounter for screening mammogram for malignant neoplasm of breast: Secondary | ICD-10-CM | POA: Diagnosis not present

## 2022-04-21 DIAGNOSIS — Z1151 Encounter for screening for human papillomavirus (HPV): Secondary | ICD-10-CM | POA: Diagnosis not present

## 2022-04-21 DIAGNOSIS — Z124 Encounter for screening for malignant neoplasm of cervix: Secondary | ICD-10-CM | POA: Diagnosis not present

## 2022-06-28 DIAGNOSIS — E7800 Pure hypercholesterolemia, unspecified: Secondary | ICD-10-CM | POA: Diagnosis not present

## 2022-09-09 DIAGNOSIS — N760 Acute vaginitis: Secondary | ICD-10-CM | POA: Diagnosis not present

## 2022-09-09 DIAGNOSIS — R300 Dysuria: Secondary | ICD-10-CM | POA: Diagnosis not present

## 2022-10-15 DIAGNOSIS — E1165 Type 2 diabetes mellitus with hyperglycemia: Secondary | ICD-10-CM | POA: Diagnosis not present

## 2022-10-15 DIAGNOSIS — L293 Anogenital pruritus, unspecified: Secondary | ICD-10-CM | POA: Diagnosis not present

## 2022-10-15 DIAGNOSIS — L299 Pruritus, unspecified: Secondary | ICD-10-CM | POA: Diagnosis not present

## 2022-11-15 DIAGNOSIS — E1165 Type 2 diabetes mellitus with hyperglycemia: Secondary | ICD-10-CM | POA: Diagnosis not present

## 2022-11-15 DIAGNOSIS — E7800 Pure hypercholesterolemia, unspecified: Secondary | ICD-10-CM | POA: Diagnosis not present

## 2023-03-14 DIAGNOSIS — Z0000 Encounter for general adult medical examination without abnormal findings: Secondary | ICD-10-CM | POA: Diagnosis not present

## 2023-03-14 DIAGNOSIS — I700 Atherosclerosis of aorta: Secondary | ICD-10-CM | POA: Diagnosis not present

## 2023-03-14 DIAGNOSIS — E1165 Type 2 diabetes mellitus with hyperglycemia: Secondary | ICD-10-CM | POA: Diagnosis not present

## 2023-03-14 DIAGNOSIS — E7800 Pure hypercholesterolemia, unspecified: Secondary | ICD-10-CM | POA: Diagnosis not present

## 2023-05-13 DIAGNOSIS — H35033 Hypertensive retinopathy, bilateral: Secondary | ICD-10-CM | POA: Diagnosis not present

## 2023-05-19 DIAGNOSIS — N912 Amenorrhea, unspecified: Secondary | ICD-10-CM | POA: Diagnosis not present

## 2023-05-19 DIAGNOSIS — Z1231 Encounter for screening mammogram for malignant neoplasm of breast: Secondary | ICD-10-CM | POA: Diagnosis not present

## 2023-05-19 DIAGNOSIS — Z01419 Encounter for gynecological examination (general) (routine) without abnormal findings: Secondary | ICD-10-CM | POA: Diagnosis not present

## 2023-05-19 DIAGNOSIS — Z6830 Body mass index (BMI) 30.0-30.9, adult: Secondary | ICD-10-CM | POA: Diagnosis not present

## 2023-05-19 DIAGNOSIS — Z1151 Encounter for screening for human papillomavirus (HPV): Secondary | ICD-10-CM | POA: Diagnosis not present

## 2023-05-19 DIAGNOSIS — Z124 Encounter for screening for malignant neoplasm of cervix: Secondary | ICD-10-CM | POA: Diagnosis not present

## 2023-07-14 DIAGNOSIS — N870 Mild cervical dysplasia: Secondary | ICD-10-CM | POA: Diagnosis not present

## 2023-07-14 DIAGNOSIS — R87612 Low grade squamous intraepithelial lesion on cytologic smear of cervix (LGSIL): Secondary | ICD-10-CM | POA: Diagnosis not present

## 2023-08-05 IMAGING — CT CT ABD-PELV W/ CM
2 of 5 series · 15 of 46 positions shown, 17 images · IV contrast (agent unspecified)
Comparison: October 20, 2011

CLINICAL DATA: Left lower quadrant abdominal pain, lymph nodes
swollen groin area, spider bite June 2021. Intermittent
constipation.

EXAM:
CT ABDOMEN AND PELVIS WITH CONTRAST
TECHNIQUE: Multidetector CT imaging of the abdomen and pelvis was performed
using the standard protocol following bolus administration of
intravenous contrast.

[Series 2: abd pelvis 5.00 br40 s3 axial · axial · 0.76mm/px · z∈[+1066,+1506]mm · 12 of 100 slices shown, 14 images]
[im 6/100  soft-tissue]
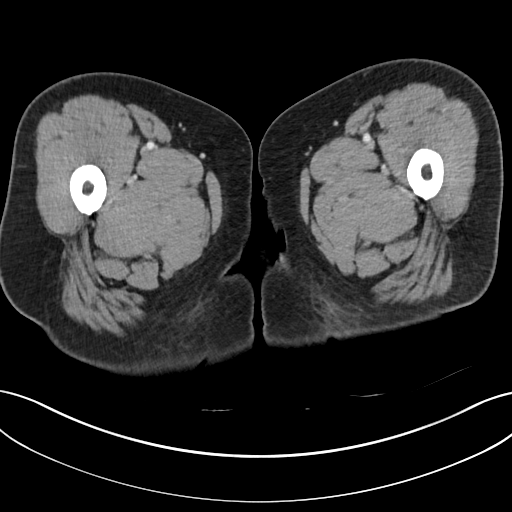
[im 6/100  bone]
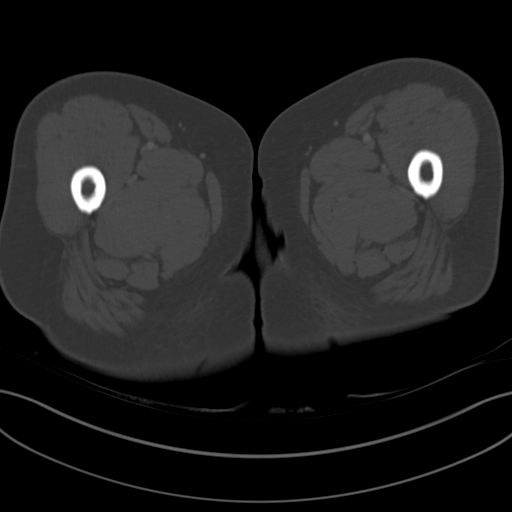
[im 16/100  soft-tissue]
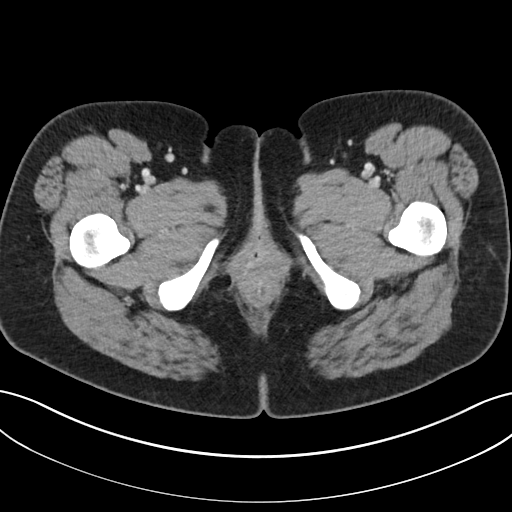
[im 21/100  soft-tissue]
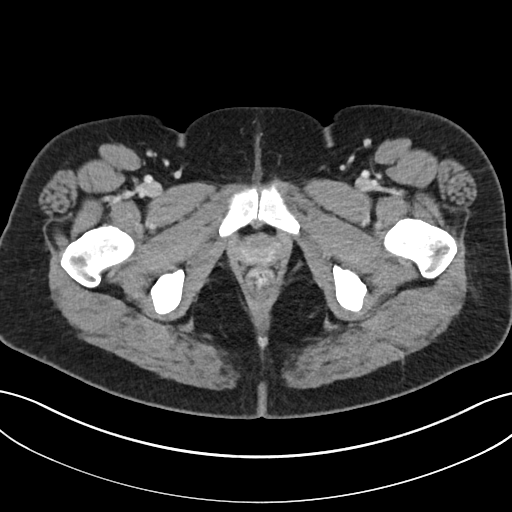
[im 32/100  soft-tissue]
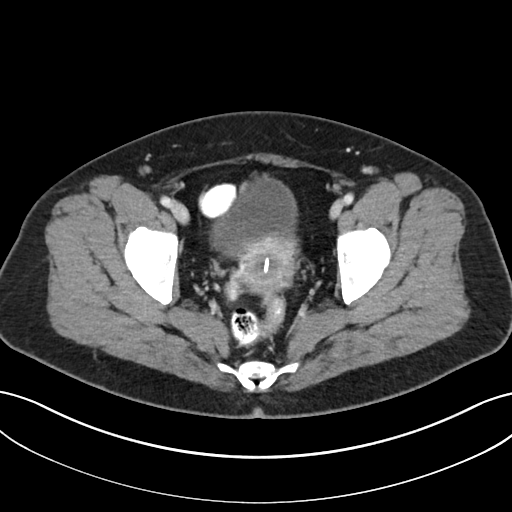
[im 37/100  soft-tissue]
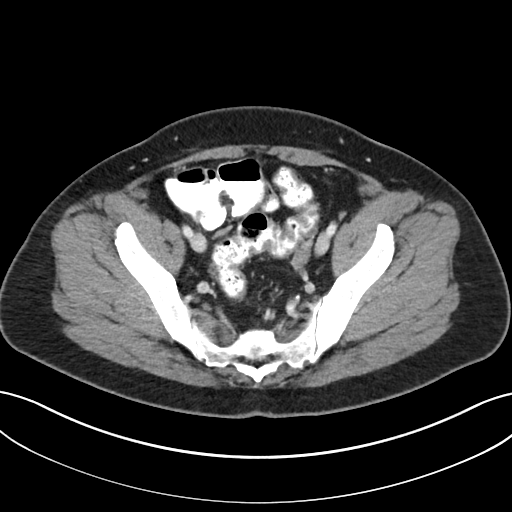
[im 47/100  soft-tissue]
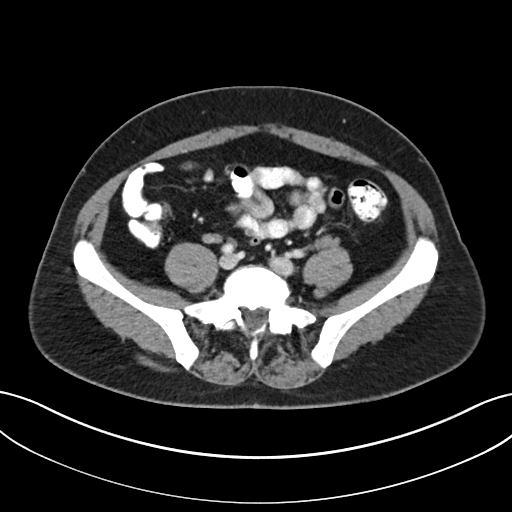
[im 53/100  soft-tissue]
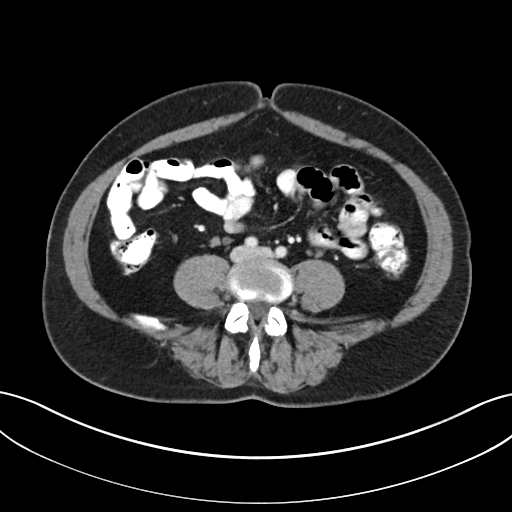
[im 63/100  soft-tissue]
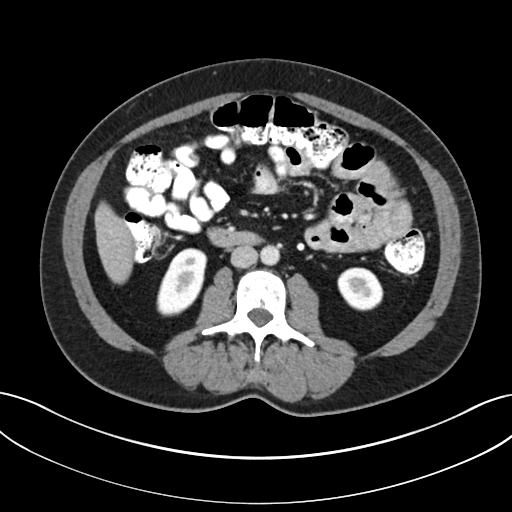
[im 68/100  soft-tissue]
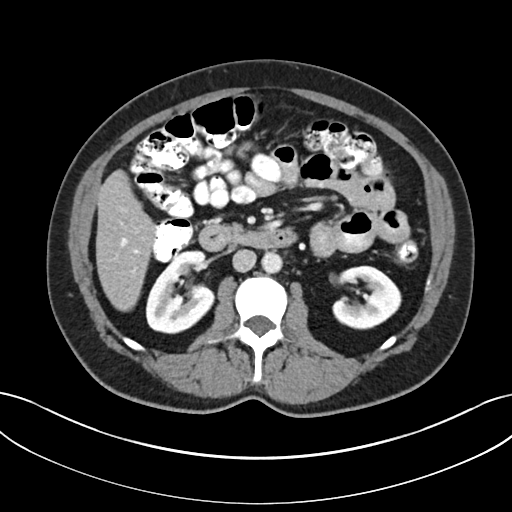
[im 68/100  bone]
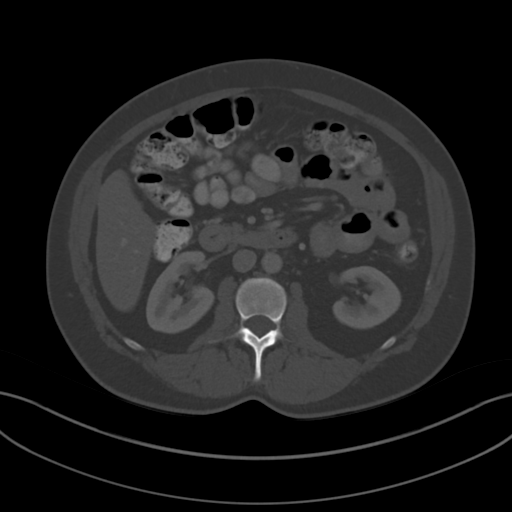
[im 79/100  soft-tissue]
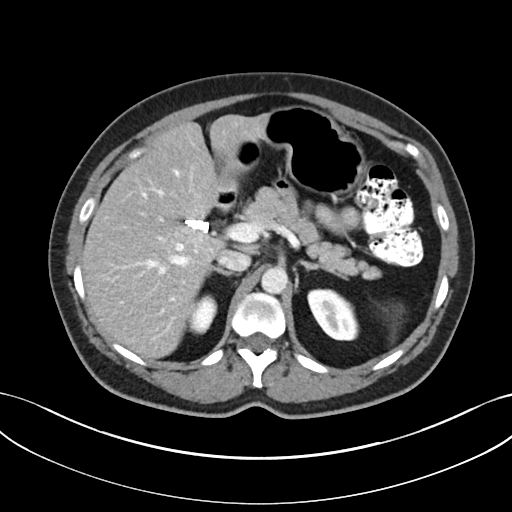
[im 84/100  soft-tissue]
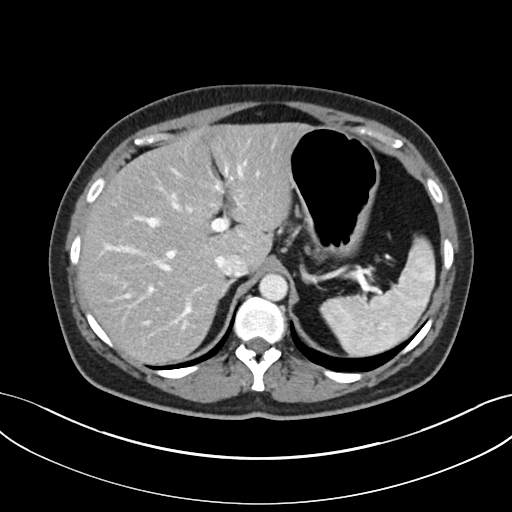
[im 94/100  soft-tissue]
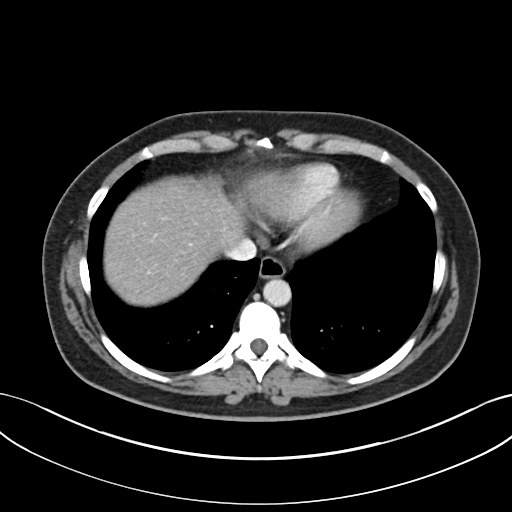

[Series 6: abd pelvis 2.00 br40 s3 cor · coronal · 0.76mm/px · 3 of 188 slices shown]
[im 63/188  soft-tissue]
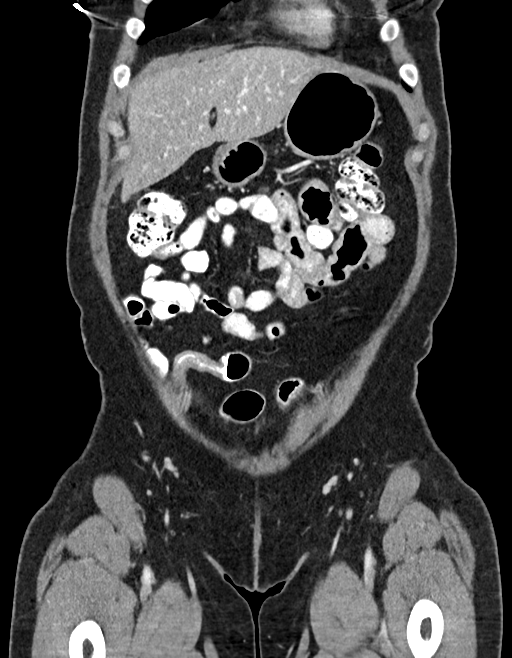
[im 84/188  soft-tissue]
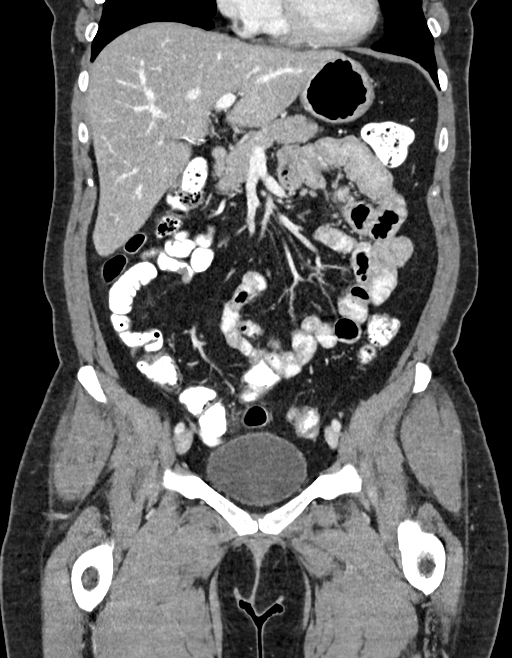
[im 104/188  soft-tissue]
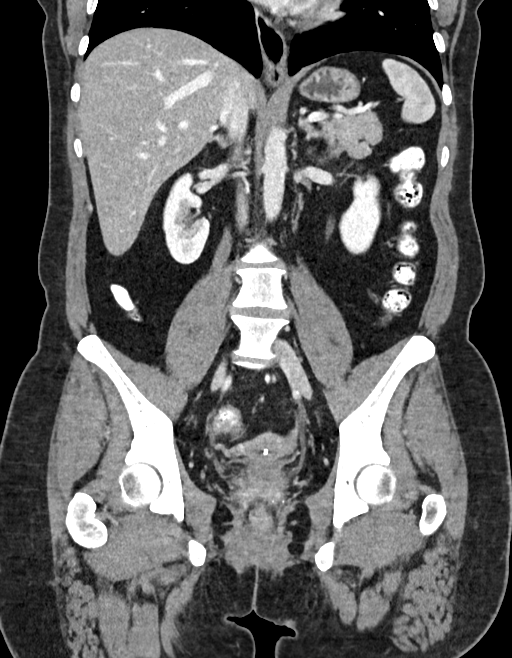

[15 of 46 positions shown; findings below may reference images not displayed]

RADIATION DOSE REDUCTION: This exam was performed according to the
departmental dose-optimization program which includes automated
exposure control, adjustment of the mA and/or kV according to
patient size and/or use of iterative reconstruction technique.

Creatinine was obtained on site at [HOSPITAL] at [REDACTED].

Results: Creatinine 0.9 mg/dL.

CONTRAST:  100mL [id number] IOPAMIDOL ([id number]) INJECTION 61%
FINDINGS: Lower chest: No acute abnormality.

Hepatobiliary: No suspicious hepatic lesion. Gallbladder surgically
absent. No biliary ductal dilation.

Pancreas: No pancreatic ductal dilation or evidence of acute
inflammation.

Spleen: No splenomegaly or focal splenic lesion.

Adrenals/Urinary Tract: Bilateral adrenal glands appear normal. No
hydronephrosis. Kidneys demonstrate symmetric enhancement and
excretion of contrast material. No solid enhancing renal mass.
Urinary bladder is unremarkable for degree of distension.

Stomach/Bowel: Radiopaque enteric contrast material traverses the
rectum. Stomach is unremarkable for degree of distension. No
pathologic dilation of small or large bowel. The appendix and
terminal ileum appear normal. No evidence of acute bowel
inflammation.

Vascular/Lymphatic: Scattered aortic atherosclerosis without
abdominal aortic aneurysm. No pathologically enlarged abdominopelvic
or inguinal lymph nodes.

Reproductive: Intrauterine device appears appropriate in
positioning. No suspicious adnexal mass.

Other: No significant abdominopelvic free fluid.

Musculoskeletal: L5-S1 discogenic disease. No acute osseous
abnormality.
IMPRESSION: 1. No acute findings in the abdomen or pelvis. Specifically, no
evidence of inguinal lymphadenopathy.
2. Moderate volume of formed stool throughout the colon suggestive
of constipation.
3.  Aortic Atherosclerosis ([id number].I).

## 2023-09-13 DIAGNOSIS — E1165 Type 2 diabetes mellitus with hyperglycemia: Secondary | ICD-10-CM | POA: Diagnosis not present

## 2023-09-13 DIAGNOSIS — E7800 Pure hypercholesterolemia, unspecified: Secondary | ICD-10-CM | POA: Diagnosis not present

## 2024-04-10 DIAGNOSIS — E1165 Type 2 diabetes mellitus with hyperglycemia: Secondary | ICD-10-CM | POA: Diagnosis not present

## 2024-04-10 DIAGNOSIS — E7800 Pure hypercholesterolemia, unspecified: Secondary | ICD-10-CM | POA: Diagnosis not present

## 2024-04-10 DIAGNOSIS — R030 Elevated blood-pressure reading, without diagnosis of hypertension: Secondary | ICD-10-CM | POA: Diagnosis not present

## 2024-04-10 DIAGNOSIS — Z0000 Encounter for general adult medical examination without abnormal findings: Secondary | ICD-10-CM | POA: Diagnosis not present
